# Patient Record
Sex: Female | Born: 1945 | Race: White | Hispanic: No | Smoking: Former smoker
Health system: Southern US, Community
[De-identification: ages and names within clinical notes are randomized; demographics above are authoritative.]

## PROBLEM LIST (undated history)

## (undated) DIAGNOSIS — M2042 Other hammer toe(s) (acquired), left foot: Secondary | ICD-10-CM

## (undated) DIAGNOSIS — K219 Gastro-esophageal reflux disease without esophagitis: Secondary | ICD-10-CM

## (undated) DIAGNOSIS — M2041 Other hammer toe(s) (acquired), right foot: Secondary | ICD-10-CM

## (undated) DIAGNOSIS — R200 Anesthesia of skin: Secondary | ICD-10-CM

## (undated) HISTORY — DX: Gastro-esophageal reflux disease without esophagitis: K21.9

## (undated) HISTORY — DX: Other hammer toe(s) (acquired), right foot: M20.42

## (undated) HISTORY — DX: Other hammer toe(s) (acquired), right foot: M20.41

## (undated) HISTORY — DX: Anesthesia of skin: R20.0

---

## 1964-10-19 HISTORY — PX: NOSE SURGERY: SHX723

## 2017-08-13 ENCOUNTER — Ambulatory Visit (INDEPENDENT_AMBULATORY_CARE_PROVIDER_SITE_OTHER): Payer: Medicare Other

## 2017-08-13 ENCOUNTER — Encounter (INDEPENDENT_AMBULATORY_CARE_PROVIDER_SITE_OTHER): Payer: Self-pay | Admitting: Specialist

## 2017-08-13 ENCOUNTER — Ambulatory Visit (INDEPENDENT_AMBULATORY_CARE_PROVIDER_SITE_OTHER): Payer: Medicare Other | Admitting: Specialist

## 2017-08-13 VITALS — BP 164/85 | HR 64 | Ht 68.0 in | Wt 160.0 lb

## 2017-08-13 DIAGNOSIS — M5441 Lumbago with sciatica, right side: Secondary | ICD-10-CM

## 2017-08-13 DIAGNOSIS — M19041 Primary osteoarthritis, right hand: Secondary | ICD-10-CM | POA: Diagnosis not present

## 2017-08-13 DIAGNOSIS — R200 Anesthesia of skin: Secondary | ICD-10-CM | POA: Diagnosis not present

## 2017-08-13 MED ORDER — MELOXICAM 15 MG PO TABS: 15.0000 mg | ORAL_TABLET | Freq: Every day | ORAL | 3 refills | Status: AC

## 2017-08-13 NOTE — Progress Notes (Signed)
Office Visit Note   Patient: Jill George           Date of Birth: [birth year]           MRN: [id number] Visit Date: 08/13/2017              Requested by: Kathrin Greathouse, NT No address on file PCP: Patient, No Pcp Per   Assessment & Plan: Visit Diagnoses:  1. Right-sided low back pain with right-sided sciatica, unspecified chronicity   2. Primary osteoarthritis, right hand   3. Numbness of legs     Plan:Avoid frequent bending and stooping  No lifting greater than 10 lbs. May use ice or moist heat for pain. Weight loss is of benefit. Handicap license is approved.  Avoid overhead lifting and overhead use of the arms. Do not lift greater than 10 lbs. Tylenol ES one every 6-8 hours for pain and inflamation. Meloxicam 15 mg one time per day.. PT for another 2-3 weeks, then a home exercise program. Referral to neurology for eval of bilateral leg numbness. Referral to PT, please call the therapist you saw for your shoulder and use the presciption provided to obtain therapy.  Follow-Up Instructions: Return in about 5 weeks (around 09/17/2017).   Orders:  Orders Placed This Encounter  Procedures  . XR Lumbar Spine 2-3 Views   No orders of the defined types were placed in this encounter.     Procedures: No procedures performed   Clinical Data: No additional findings.   Subjective: Chief Complaint  Patient presents with  . Lower Back - Pain  . Right Leg - Numbness    71 year old female with history of back pain since 2006. At that time she fell when she caught her leg while in a bathroom falling onto her back betwenn the shoulder blades and the lower back.  She did not see anyone or have xrays. Her husband was ill and was in the hospital for 6-7 months with pancreatitis, he passed away this past 12/01/16. Her joints are wearing out. She has pain in the left shoulder especially with lying on the left. Pain the left shoulder joint, had xray done in [place]. [place], . She  had trouble with a left shoulder being stiff with decreased ROM about 4-5 year ago. Right arm with alittle pain. Right thumb basal joint pain. Back pain is worse with bending stooping, sweeping and mopping. She is using the mower intermittantly.    Review of Systems  Constitutional: Positive for fatigue. Negative for activity change, appetite change and unexpected weight change.  HENT: Positive for congestion, rhinorrhea, sneezing, sore throat, tinnitus and voice change. Negative for trouble swallowing.   Eyes: Positive for itching and visual disturbance. Negative for photophobia, pain, discharge and redness.  Respiratory: Positive for cough, chest tightness and shortness of breath. Negative for wheezing.   Cardiovascular: Negative.  Negative for chest pain, palpitations and leg swelling.  Gastrointestinal: Negative.  Negative for abdominal distention, abdominal pain, anal bleeding, blood in stool, constipation, diarrhea, nausea, rectal pain and vomiting.  Endocrine: Positive for cold intolerance. Negative for heat intolerance.  Genitourinary: Positive for urgency. Negative for difficulty urinating, dyspareunia, dysuria, enuresis, flank pain, frequency and hematuria.  Musculoskeletal: Positive for arthralgias, back pain, joint swelling and neck stiffness.  Skin: Negative.  Negative for color change, pallor, rash and wound.  Allergic/Immunologic: Positive for environmental allergies. Negative for food allergies.  Neurological: Positive for light-headedness, numbness and headaches. Negative for dizziness, tremors, seizures, syncope, facial asymmetry,  speech difficulty and weakness.  Hematological: Negative.  Negative for adenopathy. Does not bruise/bleed easily.  Psychiatric/Behavioral: Negative.  Negative for agitation, behavioral problems, confusion, decreased concentration, dysphoric mood, hallucinations, self-injury, sleep disturbance and suicidal ideas. The patient is not nervous/anxious and is  not hyperactive.      Objective: Vital Signs: BP (!) 164/85 (BP Location: Left Arm, Patient Position: Sitting)   Pulse 64   Ht 5\' 8"  (1.727 m)   Wt 160 lb (72.6 kg)   BMI 24.33 kg/m   Physical Exam  Constitutional: She is oriented to person, place, and time. She appears well-developed and well-nourished. No distress.  HENT:  Head: Normocephalic and atraumatic.  Eyes: Pupils are equal, round, and reactive to light. EOM are normal. Right eye exhibits no discharge. Left eye exhibits no discharge.  Neck: Normal range of motion. Neck supple.  Pulmonary/Chest: Effort normal and breath sounds normal. No respiratory distress. She has no wheezes.  Abdominal: Soft. Bowel sounds are normal. She exhibits no distension and no mass. There is no tenderness. There is no guarding.  Musculoskeletal: She exhibits no edema, tenderness or deformity.  Neurological: She is alert and oriented to person, place, and time.  Skin: Skin is warm and dry. No rash noted. She is not diaphoretic. No erythema. No pallor.  Psychiatric: She has a normal mood and affect. Her behavior is normal. Judgment and thought content normal.    Right Hip Exam  Right hip exam is normal.   Range of Motion  Extension: normal  Flexion: normal  Internal Rotation: normal  External Rotation: normal  Abduction: normal  Adduction: normal   Muscle Strength  Abduction: 5/5  Adduction: 5/5  Flexion: 5/5   Tests  FABER: positive  Other  Erythema: absent Scars: absent Sensation: normal Pulse: present   Left Hip Exam  Left hip exam is normal.  Range of Motion  Extension: normal  Flexion: normal  Internal Rotation: normal  External Rotation: normal  Abduction: normal  Adduction: normal   Muscle Strength  Abduction: 5/5  Adduction: 5/5  Flexion: 5/5   Tests  FABER: negative Ober: negative  Other  Sensation: normal Pulse: present   Back Exam   Tenderness  The patient is experiencing tenderness in the  lumbar.  Range of Motion  Extension: abnormal  Flexion:  80 abnormal  Lateral Bend Right: normal  Lateral Bend Left: normal  Rotation Right: normal  Rotation Left: normal   Muscle Strength  The patient has normal back strength. Right Quadriceps:  5/5  Left Quadriceps:  5/5  Right Hamstrings:  5/5  Left Hamstrings:  5/5   Tests  Straight leg raise right: negative Straight leg raise left: negative  Reflexes  Patellar: normal Achilles: normal Biceps: normal Babinski's sign: normal   Other  Toe Walk: normal Heel Walk: normal Erythema: no back redness Scars: absent   Right Hand Exam   Tenderness  The patient is experiencing tenderness in the snuff box, radial area and dorsal area.  Range of Motion   Wrist  Extension: normal  Flexion: normal  Pronation: normal  Supination: normal   Muscle Strength  Wrist Extension: 5/5  Wrist Flexion: 5/5  Grip: 5/5   Tests  Phalen's Sign: negative Tinel's Sign (Medial Nerve): negative Finkelstein: negative  Other  Erythema: absent Scars: absent Sensation: normal Pulse: present  Comments:  Right thumb MCC and CC joint with mild swelling       Specialty Comments:  No specialty comments available.  Imaging: Xr Lumbar  Spine 2-3 Views  Result Date: 08/13/2017 Left lumbar curve 5 degrees with right laterallisthesis L2-3 and L3-4, lateral radiograph without anterolisthesis there is mild disc narrowing L4-5 and moderate narrowing L5-S1 with anterior spurs L4-5 and L5-S1.    PMFS History: There are no active problems to display for this patient.  No past medical history on file.  No family history on file.  Past Surgical History:  Procedure Laterality Date  . NOSE SURGERY  1966   Social History   Occupational History  . Not on file.   Social History Main Topics  . Smoking status: Former Games developermoker  . Smokeless tobacco: Never Used  . Alcohol use Not on file  . Drug use: Unknown  . Sexual activity: Not on  file

## 2017-08-13 NOTE — Patient Instructions (Addendum)
Avoid frequent bending and stooping  No lifting greater than 10 lbs. May use ice or moist heat for pain. Weight loss is of benefit. Handicap license is approved.  Avoid overhead lifting and overhead use of the arms. Do not lift greater than 10 lbs. Tylenol ES one every 6-8 hours for pain and inflamation. Meloxicam 15 mg one time per day.. PT for another 2-3 weeks, then a home exercise program. Referral to neurology for eval of bilateral leg numbness. Referral to PT, please call the therapist you saw for your shoulder and use the presciption provided to obtain therapy.

## 2017-09-24 ENCOUNTER — Ambulatory Visit (INDEPENDENT_AMBULATORY_CARE_PROVIDER_SITE_OTHER): Payer: Medicare Other | Admitting: Specialist

## 2017-11-08 ENCOUNTER — Ambulatory Visit (INDEPENDENT_AMBULATORY_CARE_PROVIDER_SITE_OTHER): Payer: Medicare Other | Admitting: Neurology

## 2017-11-08 ENCOUNTER — Encounter: Payer: Self-pay | Admitting: Neurology

## 2017-11-08 VITALS — BP 148/69 | HR 63 | Ht 68.0 in | Wt 162.0 lb

## 2017-11-08 DIAGNOSIS — M5416 Radiculopathy, lumbar region: Secondary | ICD-10-CM | POA: Insufficient documentation

## 2017-11-08 DIAGNOSIS — M5136 Other intervertebral disc degeneration, lumbar region: Secondary | ICD-10-CM

## 2017-11-08 MED ORDER — NORTRIPTYLINE HCL 10 MG PO CAPS: 10.0000 mg | ORAL_CAPSULE | Freq: Every day | ORAL | 11 refills | Status: AC

## 2017-11-08 MED ORDER — GABAPENTIN 100 MG PO CAPS: 100.0000 mg | ORAL_CAPSULE | Freq: Three times a day (TID) | ORAL | 11 refills | Status: AC

## 2017-11-08 NOTE — Progress Notes (Signed)
PATIENT: Jill George DOB: 03/16/1946  Chief Complaint  Patient presents with  . Numbness    Reports numbness in right leg into her right foot that has been progressing since 2006.  Recently, she has also started experiencing numbness in her left toes.  . Orthopaedics    Jill ChampagneNitka, James E, MD - referring provider  . PCP    Jill GreathouseWright, Pamela, PA     HISTORICAL  Jill George is a 72 years old female, seen in refer by orthopedic surgeon Dr. Kerrin ChampagneNitka, James George, for evaluation of right leg paresthesia, initial evaluation was on November 08, 2017.  She complains of right-sided radicular pain since 2006, starting from right lower back, radiating to right lateral thigh and, right lateral leg, gradually getting worse, she was treated with chiropractor in 2016 without significant improvement, she now noticed right calf muscle atrophy, mildly limited, dragging her right foot, right side hammertoes, intermittent left leg numbness,  She denies bowel bladder incontinence,  Personally reviewed x-ray of lumbar in October 2018, left lumbar curve 5 degree with right lateral listhesis L2-3, and L3-4, L4-5 moderate narrowing  REVIEW OF SYSTEMS: Full 14 system review of systems performed and notable only for depression, too much sleep, change in appetite, recent falls, numbness, joint pain, runny nose, incontinence, itching, ringing the ears, fatigue  ALLERGIES: No Known Allergies  HOME MEDICATIONS: Current Outpatient Medications  Medication Sig Dispense Refill  . ALPRAZolam (XANAX) 0.25 MG tablet Take 0.25 mg by mouth daily.    . calcium citrate-vitamin D (CITRACAL+D) 315-200 MG-UNIT tablet Take 1 tablet by mouth 2 (two) times daily.    . cholecalciferol (VITAMIN D) 400 units TABS tablet Take 400 Units by mouth.    . doxepin (SINEQUAN) 10 MG capsule Take 0.1 mg by mouth daily.    Marland Kitchen. loratadine (CLARITIN) 10 MG tablet Take 10 mg by mouth daily.    . meloxicam (MOBIC) 15 MG tablet Take 1 tablet (15 mg  total) by mouth daily. 30 tablet 3  . Multiple Vitamins-Minerals (CENTRUM SILVER PO) Take by mouth.    . psyllium (METAMUCIL) 58.6 % packet Take 1 packet by mouth daily.    . ranitidine (ZANTAC) 150 MG tablet Take 150 mg by mouth daily.    . vitamin B-12 (CYANOCOBALAMIN) 1000 MCG tablet Take 1,000 mcg by mouth daily.     No current facility-administered medications for this visit.     PAST MEDICAL HISTORY: Past Medical History:  Diagnosis Date  . Acid reflux   . Hammer toes, bilateral   . Numbness     PAST SURGICAL HISTORY: Past Surgical History:  Procedure Laterality Date  . NOSE SURGERY  1966    FAMILY HISTORY: Family History  Problem Relation Age of Onset  . Heart disease Mother   . Other Father        tractor accident    SOCIAL HISTORY:  Social History   Socioeconomic History  . Marital status: Widowed    Spouse name: Not on file  . Number of children: 1  . Years of education: 6212  . Highest education level: High school graduate  Social Needs  . Financial resource strain: Not on file  . Food insecurity - worry: Not on file  . Food insecurity - inability: Not on file  . Transportation needs - medical: Not on file  . Transportation needs - non-medical: Not on file  Occupational History  . Occupation: Retired  Tobacco Use  . Smoking status: Former Games developermoker  .  Smokeless tobacco: Never Used  Substance and Sexual Activity  . Alcohol use: No    Frequency: Never  . Drug use: No  . Sexual activity: Not on file  Other Topics Concern  . Not on file  Social History Narrative   Lives at home alone.   Right-handed.   No caffeine use.     PHYSICAL EXAM   Vitals:   11/08/17 1258  BP: (!) 148/69  Pulse: 63  Weight: 162 lb (73.5 kg)  Height: 5\' 8"  (1.727 m)    Not recorded      Body mass index is 24.63 kg/m.  PHYSICAL EXAMNIATION:  Gen: NAD, conversant, well nourised, obese, well groomed                     Cardiovascular: Regular rate rhythm, no  peripheral edema, warm, nontender. Eyes: Conjunctivae clear without exudates or hemorrhage Neck: Supple, no carotid bruits. Pulmonary: Clear to auscultation bilaterally   NEUROLOGICAL EXAM:  MENTAL STATUS: Speech:    Speech is normal; fluent and spontaneous with normal comprehension.  Cognition:     Orientation to time, place and person     Normal recent and remote memory     Normal Attention span and concentration     Normal Language, naming, repeating,spontaneous speech     Fund of knowledge   CRANIAL NERVES: CN II: Visual fields are full to confrontation. Fundoscopic exam is normal with sharp discs and no vascular changes. Pupils are round equal and briskly reactive to light. CN III, IV, VI: extraocular movement are normal. No ptosis. CN V: Facial sensation is intact to pinprick in all 3 divisions bilaterally. Corneal responses are intact.  CN VII: Face is symmetric with normal eye closure and smile. CN VIII: Hearing is normal to rubbing fingers CN IX, X: Palate elevates symmetrically. Phonation is normal. CN XI: Head turning and shoulder shrug are intact CN XII: Tongue is midline with normal movements and no atrophy.  MOTOR: Right calf muscle atrophy, right hammertoes, mild weakness in right total extension and flexion  REFLEXES: Reflexes are 2+ and symmetric at the biceps, triceps, knees, and ankles. Plantar responses are flexor.  SENSORY: Intact to light touch, pinprick, positional sensation and vibratory sensation are intact in fingers and toes.  COORDINATION: Rapid alternating movements and fine finger movements are intact. There is no dysmetria on finger-to-nose and heel-knee-shin.    GAIT/STANCE: Posture is normal. Gait is steady with normal steps, base, arm swing, and turning. Heel and toe walking are normal. Tandem gait is normal.  Romberg is absent.   DIAGNOSTIC DATA (LABS, IMAGING, TESTING) - I reviewed patient records, labs, notes, testing and imaging  myself where available.   ASSESSMENT AND PLAN  Jill George is a 72 y.o. female   Right lumbosacral radiculopathy  Proceed with MRI of lumbar  EMG nerve conduction study  She has tried gabapentin in the past, could not tolerated, will try low-dose nortriptyline 10 mg every day   Levert Feinstein, M.D. Ph.D.  [facility] [address] [place], [place] [address] Ph: [phone] Fax: [phone]  [id number], Guy Sandifer, MD

## 2017-11-15 ENCOUNTER — Ambulatory Visit (INDEPENDENT_AMBULATORY_CARE_PROVIDER_SITE_OTHER): Payer: Medicare Other | Admitting: Specialist

## 2017-11-22 ENCOUNTER — Ambulatory Visit
Admission: RE | Admit: 2017-11-22 | Discharge: 2017-11-22 | Disposition: A | Discharge status: Home / Self Care | Payer: Medicare Other | Source: Ambulatory Visit | Attending: Neurology | Admitting: Neurology

## 2017-11-22 DIAGNOSIS — M5136 Other intervertebral disc degeneration, lumbar region: Secondary | ICD-10-CM

## 2017-11-24 ENCOUNTER — Telehealth: Payer: Self-pay | Admitting: Neurology

## 2017-11-24 NOTE — Telephone Encounter (Signed)
MRI of lumbar spine showed multiple level degenerative changes, most severe at L5-S1, potential S1 nerve root compression, we will review films at her EMG nerve conduction appointment on November 26, 2016,  IMPRESSION: Small central disc protrusion L2-3 without stenosis  Mild subarticular stenosis bilaterally L4-5  Right-sided disc protrusion L5-S1 with right S1 nerve root impingement.

## 2017-11-26 ENCOUNTER — Encounter (INDEPENDENT_AMBULATORY_CARE_PROVIDER_SITE_OTHER): Payer: Medicare Other

## 2017-11-26 ENCOUNTER — Ambulatory Visit (INDEPENDENT_AMBULATORY_CARE_PROVIDER_SITE_OTHER): Payer: Medicare Other | Admitting: Neurology

## 2017-11-26 DIAGNOSIS — Z0289 Encounter for other administrative examinations: Secondary | ICD-10-CM

## 2017-11-26 DIAGNOSIS — M5136 Other intervertebral disc degeneration, lumbar region: Secondary | ICD-10-CM

## 2017-11-26 DIAGNOSIS — M5416 Radiculopathy, lumbar region: Secondary | ICD-10-CM

## 2017-11-26 NOTE — Progress Notes (Signed)
PATIENT: Jill George DOB: 1945-11-06  No chief complaint on file.    HISTORICAL  Jill George is a 72 years old female, seen in refer by orthopedic surgeon Dr. Kerrin ChampagneNitka, James E, for evaluation of right leg paresthesia, initial evaluation was on November 08, 2017.  She complains of right-sided radicular pain since 2006, starting from right lower back, radiating to right lateral thigh and, right lateral leg, gradually getting worse, she was treated with chiropractor in 2016 without significant improvement, she now noticed right calf muscle atrophy, mildly limited, dragging her right foot, right side hammertoes, intermittent left leg numbness,  She denies bowel bladder incontinence,  Personally reviewed x-ray of lumbar in October 2018, left lumbar curve 5 degree with right lateral listhesis L2-3, and L3-4, L4-5 moderate narrowing  UPDATE Nov 26 2017: She complains of right lateral leg numbness, radiating paresthesia to the bottom of her right foot, mild weakness of right leg, atrophy of right calf muscles,  EMG nerve conduction study today showed mild chronic right S1 radiculopathy,  We have personally reviewed MRI of lumbar spine in February 2019, right side disc protrusion at L5-S1 with right S1 nerve root impingement   She reported mild improvement with nortriptyline 10 mg every night   REVIEW OF SYSTEMS: Full 14 system review of systems performed and notable only for depression, too much sleep, change in appetite, recent falls, numbness, joint pain, runny nose, incontinence, itching, ringing the ears, fatigue  ALLERGIES: No Known Allergies  HOME MEDICATIONS: Current Outpatient Medications  Medication Sig Dispense Refill  . ALPRAZolam (XANAX) 0.25 MG tablet Take 0.25 mg by mouth daily.    . calcium citrate-vitamin D (CITRACAL+D) 315-200 MG-UNIT tablet Take 1 tablet by mouth 2 (two) times daily.    . cholecalciferol (VITAMIN D) 400 units TABS tablet Take 400 Units by mouth.     . doxepin (SINEQUAN) 10 MG capsule Take 0.1 mg by mouth daily.    Marland Kitchen. gabapentin (NEURONTIN) 100 MG capsule Take 1 capsule (100 mg total) by mouth 3 (three) times daily. 90 capsule 11  . loratadine (CLARITIN) 10 MG tablet Take 10 mg by mouth daily.    . meloxicam (MOBIC) 15 MG tablet Take 1 tablet (15 mg total) by mouth daily. 30 tablet 3  . Multiple Vitamins-Minerals (CENTRUM SILVER PO) Take by mouth.    . nortriptyline (PAMELOR) 10 MG capsule Take 1 capsule (10 mg total) by mouth at bedtime. Please discard previous gabapentin order 30 capsule 11  . psyllium (METAMUCIL) 58.6 % packet Take 1 packet by mouth daily.    . ranitidine (ZANTAC) 150 MG tablet Take 150 mg by mouth daily.    . vitamin B-12 (CYANOCOBALAMIN) 1000 MCG tablet Take 1,000 mcg by mouth daily.     No current facility-administered medications for this visit.     PAST MEDICAL HISTORY: Past Medical History:  Diagnosis Date  . Acid reflux   . Hammer toes, bilateral   . Numbness     PAST SURGICAL HISTORY: Past Surgical History:  Procedure Laterality Date  . NOSE SURGERY  1966    FAMILY HISTORY: Family History  Problem Relation Age of Onset  . Heart disease Mother   . Other Father        tractor accident    SOCIAL HISTORY:  Social History   Socioeconomic History  . Marital status: Widowed    Spouse name: Not on file  . Number of children: 1  . Years of education: 3012  . Highest  education level: High school graduate  Social Needs  . Financial resource strain: Not on file  . Food insecurity - worry: Not on file  . Food insecurity - inability: Not on file  . Transportation needs - medical: Not on file  . Transportation needs - non-medical: Not on file  Occupational History  . Occupation: Retired  Tobacco Use  . Smoking status: Former Games developer  . Smokeless tobacco: Never Used  Substance and Sexual Activity  . Alcohol use: No    Frequency: Never  . Drug use: No  . Sexual activity: Not on file  Other  Topics Concern  . Not on file  Social History Narrative   Lives at home alone.   Right-handed.   No caffeine use.     PHYSICAL EXAM   There were no vitals filed for this visit.  Not recorded      There is no height or weight on file to calculate BMI.  PHYSICAL EXAMNIATION:  Gen: NAD, conversant, well nourised, obese, well groomed                     Cardiovascular: Regular rate rhythm, no peripheral edema, warm, nontender. Eyes: Conjunctivae clear without exudates or hemorrhage Neck: Supple, no carotid bruits. Pulmonary: Clear to auscultation bilaterally   NEUROLOGICAL EXAM:  MENTAL STATUS: Speech:    Speech is normal; fluent and spontaneous with normal comprehension.  Cognition:     Orientation to time, place and person     Normal recent and remote memory     Normal Attention span and concentration     Normal Language, naming, repeating,spontaneous speech     Fund of knowledge   CRANIAL NERVES: CN II: Visual fields are full to confrontation. Fundoscopic exam is normal with sharp discs and no vascular changes. Pupils are round equal and briskly reactive to light. CN III, IV, VI: extraocular movement are normal. No ptosis. CN V: Facial sensation is intact to pinprick in all 3 divisions bilaterally. Corneal responses are intact.  CN VII: Face is symmetric with normal eye closure and smile. CN VIII: Hearing is normal to rubbing fingers CN IX, X: Palate elevates symmetrically. Phonation is normal. CN XI: Head turning and shoulder shrug are intact CN XII: Tongue is midline with normal movements and no atrophy.  MOTOR: Right calf muscle atrophy, right hammertoes, mild weakness in right total extension and flexion  REFLEXES: Reflexes are 2+ and symmetric at the biceps, triceps, knees, and trace at right ankle. Plantar responses are flexor.  SENSORY: Intact to light touch, pinprick, positional sensation and vibratory sensation are intact in fingers and  toes.  COORDINATION: Rapid alternating movements and fine finger movements are intact. There is no dysmetria on finger-to-nose and heel-knee-shin.    GAIT/STANCE: Posture is normal. Gait is steady with normal steps, base, arm swing, and turning.  She has difficulty with right tiptoe walking, mild difficulty with right heel walking. Romberg is absent.   DIAGNOSTIC DATA (LABS, IMAGING, TESTING) - I reviewed patient records, labs, notes, testing and imaging myself where available.   ASSESSMENT AND PLAN  Jill George is a 72 y.o. female   Right lumbosacral radiculopathy  MRI of lumbar in February 2019 showed right side disc protrusion at L5-S1 with right S1 nerve root impingement.  EMG nerve conduction study confirmed chronic right S1 radiculopathy  Continue low-dose nortriptyline 10 mg every day   Levert Feinstein, M.D. Ph.D.  [facility] [address] [place], [place] [address]  Ph: [phone] Fax: [phone]  [id number]:HQION, Guy Sandifer, MD

## 2017-11-26 NOTE — Procedures (Signed)
Full Name: Salem CasterGipsey Vandunk Gender: Female MRN #: [id number] Date of Birth: [birth year]    Visit Date: 11/26/2017 09:12 Age: 89 Years 6 Months Old Examining Physician: Levert FeinsteinYijun Patrich Heinze, MD  Referring Physician: Terrace ArabiaYan, MD History: 72 year old female, complains of right-sided low back pain, radiating pain into the right lateral leg, bottom of right foot.  Summary of the tests: Nerve conduction study: Bilateral sural, superfacial peroneal sensory responses were normal.  Bilateral tibial, peroneal to EDB motor responses were normal.  Bilateral tibial H reflexes were present, right side was 1.6 ms prolonged in comparison to the left side.  Electromyography: Selective needle examinations of right lower extremity muscles showed chronic neuropathic changes involving right medial gastrocnemius, short head of biceps femoris, slight involvement of right gluteus medius.  There was no active denervation and right lumbosacral paraspinal muscles.    Conclusion:  This is an abnormal study.  There is electrodiagnostic evidence of chronic mild right S1 radiculopathy.   ------------------------------- Levert FeinsteinYijun Dorean Hiebert, M.D.  [facility] [address] [place], [place] [address] Tel: [phone] Fax: [phone]        [facility]    Nerve / Sites Muscle Latency Ref. Amplitude Ref. Rel Amp Segments Distance Velocity Ref. Area    ms ms mV mV %  cm m/s m/s mVms  R Peroneal - EDB     Ankle EDB 5.9 ?6.5 2.8 ?2.0 100 Ankle - EDB 9   12.6     Fib head EDB 13.3  2.5  89.9 Fib head - Ankle 33 45 ?44 10.9     Pop fossa EDB 15.6  2.5  102 Pop fossa - Fib head 10 44 ?44 12.6         Pop fossa - Ankle      L Peroneal - EDB     Ankle EDB 4.7 ?6.5 4.0 ?2.0 100 Ankle - EDB 9   11.7     Fib head EDB 11.9  3.5  88.7 Fib head - Ankle 33 46 ?44 11.4     Pop fossa EDB 14.2  3.9  111 Pop fossa - Fib head 10 45 ?44 14.1         Pop fossa - Ankle      R Tibial - AH     Ankle AH 4.8 ?5.8 6.3 ?4.0 100 Ankle - AH 9    21.0     Pop fossa AH 15.0  5.0  78.7 Pop fossa - Ankle 37 36 ?41 16.7  L Tibial - AH     Ankle AH 4.6 ?5.8 5.4 ?4.0 100 Ankle - AH 9   13.3     Pop fossa AH 13.7  3.5  63.8 Pop fossa - Ankle 37 41 ?41 11.2             SNC    Nerve / Sites Rec. Site Peak Lat Ref.  Amp Ref. Segments Distance    ms ms V V  cm  R Sural - Ankle (Calf)     Calf Ankle 3.4 ?4.4 9 ?6 Calf - Ankle 14  L Sural - Ankle (Calf)     Calf Ankle 3.5 ?4.4 6 ?6 Calf - Ankle 14  R Superficial peroneal - Ankle     Lat leg Ankle 4.0 ?4.4 2 ?6 Lat leg - Ankle 14  L Superficial peroneal - Ankle     Lat leg Ankle 4.1 ?4.4 3 ?6 Lat leg - Ankle 14  F  Wave    Nerve F Lat Ref.   ms ms  R Tibial - AH 58.5 ?56.0  L Tibial - AH 57.3 ?56.0         H Reflex    Nerve H Lat Lat Hmax   ms ms   Left Right Ref. Left Right Ref.  Tibial - Soleus 40.7 41.3 ?35.0 45.9 20.4 ?35.0         EMG full       EMG Summary Table    Spontaneous MUAP Recruitment  Muscle IA Fib PSW Fasc Other Amp Dur. Poly Pattern  R. Tibialis anterior Normal None None None _______ Normal Normal Normal Normal  R. Tibialis posterior Normal None None None _______ Normal Normal Normal Normal  R. Peroneus longus Normal None None None _______ Normal Normal Normal Normal  R. Gastrocnemius (Medial head) Increased None None None _______ Increased Increased Normal Reduced  R. Vastus lateralis Normal None None None _______ Normal Normal Normal Normal  R. Biceps femoris (short head) Normal None None None _______ Increased Normal Normal Reduced  R. Gluteus medius Normal None None None _______ Increased Increased Normal Reduced  R. Lumbar paraspinals (mid) Normal None None None _______ Normal Normal Normal Normal  R. Lumbar paraspinals (low) Normal None None None _______ Normal Normal Normal Normal

## 2017-12-13 ENCOUNTER — Ambulatory Visit (INDEPENDENT_AMBULATORY_CARE_PROVIDER_SITE_OTHER): Payer: Medicare Other | Admitting: Specialist

## 2017-12-13 ENCOUNTER — Encounter (INDEPENDENT_AMBULATORY_CARE_PROVIDER_SITE_OTHER): Payer: Self-pay | Admitting: Specialist

## 2017-12-13 DIAGNOSIS — M5116 Intervertebral disc disorders with radiculopathy, lumbar region: Secondary | ICD-10-CM | POA: Diagnosis not present

## 2017-12-13 NOTE — Patient Instructions (Signed)
Avoid frequent bending and stooping  No lifting greater than 10 lbs. May use ice or moist heat for pain. Weight loss is of benefit. Handicap license is approved.   

## 2017-12-13 NOTE — Progress Notes (Addendum)
Office Visit Note   Patient: Jill George           Date of Birth: 11-09-1945           MRN: [id number] Visit Date: 12/13/2017              Requested by: No referring provider defined for this encounter. PCP: System, Pcp Not In   Assessment & Plan: Visit Diagnoses:  1. Lumbar disc herniation with radiculopathy     Plan: Avoid frequent bending and stooping  No lifting greater than 10 lbs. May use ice or moist heat for pain. Weight loss is of benefit. Handicap license is approved. Follow-Up Instructions: Return in about 4 weeks (around 01/10/2018).   Orders:  No orders of the defined types were placed in this encounter.  No orders of the defined types were placed in this encounter.     Procedures: No procedures performed   Clinical Data: No additional findings.   Subjective: No chief complaint on file.   72 year female with persistent numbness in the right leg the pain is less but the numbness persists. No bowel difficulty. Does have bladder difficutly with use of a pad intermittantly. She has been to PT and notices with prolong walking the right leg does get weak. She had excruiating had, saw a D.C. Then had acupuncture with some improvement, the leg then The right leg became numb and the pain improves substantially    Review of Systems  Constitutional: Negative.   HENT: Negative.   Eyes: Negative.   Respiratory: Negative.   Cardiovascular: Negative.   Gastrointestinal: Negative.   Endocrine: Negative.   Genitourinary: Negative.   Musculoskeletal: Negative.   Skin: Negative.   Allergic/Immunologic: Negative.   Neurological: Negative.   Hematological: Negative.   Psychiatric/Behavioral: Negative.      Objective: Vital Signs: There were no vitals taken for this visit.  Physical Exam  Constitutional: She is oriented to person, place, and time. She appears well-developed and well-nourished.  HENT:  Head: Normocephalic and atraumatic.  Eyes: EOM  are normal. Pupils are equal, round, and reactive to light.  Neck: Normal range of motion. Neck supple.  Pulmonary/Chest: Effort normal and breath sounds normal.  Abdominal: Soft. Bowel sounds are normal.  Neurological: She is alert and oriented to person, place, and time.  Skin: Skin is warm and dry.  Psychiatric: She has a normal mood and affect. Her behavior is normal. Judgment and thought content normal.    Back Exam   Range of Motion  Extension: abnormal  Flexion: abnormal  Lateral bend right: normal  Lateral bend left: normal  Rotation right: normal  Rotation left: normal   Muscle Strength  The patient has normal back strength. Right Quadriceps:  5/5  Left Quadriceps:  5/5  Right Hamstrings:  5/5  Left Hamstrings:  5/5   Tests  Straight leg raise right: negative Straight leg raise left: negative  Reflexes  Patellar: abnormal Achilles: abnormal Babinski's sign: normal   Other  Toe walk: normal Heel walk: normal Sensation: normal Erythema: no back redness Scars: absent      Specialty Comments:  No specialty comments available.  Imaging: No results found.   PMFS History: Patient Active Problem List   Diagnosis Date Noted  . Other intervertebral disc degeneration, lumbar region 11/26/2017  . Right lumbar radiculopathy 11/08/2017   Past Medical History:  Diagnosis Date  . Acid reflux   . Hammer toes, bilateral   . Numbness  Family History  Problem Relation Age of Onset  . Heart disease Mother   . Other Father        tractor accident    Past Surgical History:  Procedure Laterality Date  . NOSE SURGERY  1966   Social History   Occupational History  . Occupation: Retired  Tobacco Use  . Smoking status: Former Games developermoker  . Smokeless tobacco: Never Used  Substance and Sexual Activity  . Alcohol use: No    Frequency: Never  . Drug use: No  . Sexual activity: Not on file

## 2018-01-17 ENCOUNTER — Ambulatory Visit (INDEPENDENT_AMBULATORY_CARE_PROVIDER_SITE_OTHER): Payer: Medicare Other | Admitting: Specialist

## 2018-01-17 ENCOUNTER — Ambulatory Visit (INDEPENDENT_AMBULATORY_CARE_PROVIDER_SITE_OTHER): Payer: Self-pay

## 2018-01-17 ENCOUNTER — Encounter (INDEPENDENT_AMBULATORY_CARE_PROVIDER_SITE_OTHER): Payer: Self-pay | Admitting: Specialist

## 2018-01-17 VITALS — BP 135/64 | HR 56 | Ht 68.0 in | Wt 160.0 lb

## 2018-01-17 DIAGNOSIS — M5416 Radiculopathy, lumbar region: Secondary | ICD-10-CM | POA: Diagnosis not present

## 2018-01-17 DIAGNOSIS — M5136 Other intervertebral disc degeneration, lumbar region: Secondary | ICD-10-CM

## 2018-01-17 DIAGNOSIS — M205X1 Other deformities of toe(s) (acquired), right foot: Secondary | ICD-10-CM | POA: Diagnosis not present

## 2018-01-17 NOTE — Progress Notes (Addendum)
Office Visit Note   Patient: Jill George           Date of Birth: 1946-08-07           MRN: [id number] Visit Date: 01/17/2018              Requested by: No referring provider defined for this encounter. PCP: System, Pcp Not In   Assessment & Plan: Visit Diagnoses:  1. Degenerative disc disease, lumbar   2. Lumbar radiculopathy, chronic   3. Acquired claw toe, right     Plan: Avoid frequent bending and stooping  No lifting greater than 10 lbs. May use ice or moist heat for pain. Weight loss is of benefit. Handicap license is approved.  Follow-Up Instructions: Return in about 3 weeks (around 02/07/2018) for return with Dr. Lajoyce Corners for claw toes right foot,.   Orders:  Orders Placed This Encounter  Procedures  . XR Foot 2 Views Right   No orders of the defined types were placed in this encounter.     Procedures: No procedures performed   Clinical Data: No additional findings.   Subjective: Chief Complaint  Patient presents with  . Lower Back - Follow-up    72 year old female with history of low back and right leg pain pain worse with bending and stooping. History of back injury with sciatica in 2006, New years day. She has delt with numbness for years and has had both MRI and EMG/NCV.   Review of Systems  Constitutional: Negative.   HENT: Negative.   Eyes: Negative.   Respiratory: Negative.   Cardiovascular: Negative.   Gastrointestinal: Negative.   Endocrine: Negative.   Genitourinary: Negative.   Musculoskeletal: Negative.   Skin: Negative.   Allergic/Immunologic: Negative.   Neurological: Negative.   Hematological: Negative.   Psychiatric/Behavioral: Negative.      Objective: Vital Signs: BP 135/64 (BP Location: Left Arm, Patient Position: Sitting)   Pulse (!) 56   Ht 5\' 8"  (1.727 m)   Wt 160 lb (72.6 kg)   BMI 24.33 kg/m   Physical Exam  Constitutional: She is oriented to person, place, and time. She appears well-developed and  well-nourished.  HENT:  Head: Normocephalic and atraumatic.  Eyes: Pupils are equal, round, and reactive to light. EOM are normal.  Neck: Normal range of motion. Neck supple.  Pulmonary/Chest: Effort normal and breath sounds normal.  Abdominal: Soft. Bowel sounds are normal.  Musculoskeletal: Normal range of motion.  Neurological: She is alert and oriented to person, place, and time.  Skin: Skin is warm and dry.  Psychiatric: She has a normal mood and affect. Her behavior is normal. Judgment and thought content normal.    Right Ankle Exam  Right ankle exam is normal.  Tenderness  The patient is experiencing no tenderness.  Comments:  Right foot 3rd through 5th toe with clawing of the MPIP and DIP joints, extension at the MTP joints consistent with claw toe deformity.   Back Exam   Tenderness  The patient is experiencing tenderness in the lumbar.  Range of Motion  Extension: normal  Flexion: normal  Lateral bend right: normal  Lateral bend left: normal  Rotation right: normal  Rotation left: normal   Muscle Strength  Right Quadriceps:  5/5  Left Quadriceps:  5/5  Right Hamstrings:  5/5  Left Hamstrings:  5/5   Tests  Straight leg raise right: negative Straight leg raise left: negative  Reflexes  Babinski's sign: normal   Other  Toe  walk: normal Heel walk: normal Sensation: normal Gait: normal  Erythema: no back redness Scars: absent      Specialty Comments:  No specialty comments available.  Imaging: No results found.   PMFS History: Patient Active Problem List   Diagnosis Date Noted  . Other intervertebral disc degeneration, lumbar region 11/26/2017  . Right lumbar radiculopathy 11/08/2017   Past Medical History:  Diagnosis Date  . Acid reflux   . Hammer toes, bilateral   . Numbness     Family History  Problem Relation Age of Onset  . Heart disease Mother   . Other Father        tractor accident    Past Surgical History:  Procedure  Laterality Date  . NOSE SURGERY  1966   Social History   Occupational History  . Occupation: Retired  Tobacco Use  . Smoking status: Former Games developermoker  . Smokeless tobacco: Never Used  Substance and Sexual Activity  . Alcohol use: No    Frequency: Never  . Drug use: No  . Sexual activity: Not on file

## 2018-01-17 NOTE — Patient Instructions (Addendum)
Avoid frequent bending and stooping  No lifting greater than 10 lbs. May use ice or moist heat for pain. Weight loss is of benefit.   

## 2018-01-17 NOTE — Addendum Note (Signed)
Addended by: Vira BrownsNITKA, JAMES on: 01/17/2018 04:10 PM   Modules accepted: Orders

## 2018-01-25 ENCOUNTER — Ambulatory Visit (INDEPENDENT_AMBULATORY_CARE_PROVIDER_SITE_OTHER): Payer: Medicare Other | Admitting: Orthopedic Surgery

## 2018-01-25 ENCOUNTER — Encounter (INDEPENDENT_AMBULATORY_CARE_PROVIDER_SITE_OTHER): Payer: Self-pay | Admitting: Orthopedic Surgery

## 2018-01-25 VITALS — Ht 68.0 in | Wt 160.0 lb

## 2018-01-25 DIAGNOSIS — M205X1 Other deformities of toe(s) (acquired), right foot: Secondary | ICD-10-CM | POA: Diagnosis not present

## 2018-01-25 DIAGNOSIS — M6701 Short Achilles tendon (acquired), right ankle: Secondary | ICD-10-CM | POA: Diagnosis not present

## 2018-01-25 NOTE — Progress Notes (Signed)
   Office Visit Note   Patient: Jill George           Date of Birth: Apr 06, 1946           MRN: [id number] Visit Date: 01/25/2018              Requested by: No referring provider defined for this encounter. PCP: System, Pcp Not In  Chief Complaint  Patient presents with  . Right Foot - Pain    Discuss possible surgery      HPI: Patient is a 72 year old woman who complains of burning pain across the ball of her right foot as well as painful clawing of her toes.  Assessment & Plan: Visit Diagnoses:  1. Achilles tendon contracture, right   2. Acquired claw toe, right     Plan: Patient was given instruction and demonstrated heel cord stretching to do 5 times a day a minute at a time.  Recommended stiff soled Trail running sneaker or walking sneaker with arch support orthotics to further unload the metatarsal heads.  Follow-up in 2 months will evaluate for resolution of the burning symptoms.  Discussed that we could proceed with claw toe surgery but may need to consider a gastrocnemius recession.  Follow-Up Instructions: Return in about 2 months (around 03/27/2018).   Ortho Exam  Patient is alert, oriented, no adenopathy, well-dressed, normal affect, normal respiratory effort. Examination patient has a good dorsalis pedis pulse.  She has dorsiflexion 10 degrees short of neutral with her knee extended.  She has tenderness to palpation beneath the metatarsal head she has flexible clawing of the toes.  Imaging: No results found. No images are attached to the encounter.  Labs: No results found for: HGBA1C, ESRSEDRATE, CRP, LABURIC, REPTSTATUS, GRAMSTAIN, CULT, LABORGA  @LABSALLVALUES (HGBA1)@  Body mass index is 24.33 kg/m.  Orders:  No orders of the defined types were placed in this encounter.  No orders of the defined types were placed in this encounter.    Procedures: No procedures performed  Clinical Data: No additional findings.  ROS:  All other systems  negative, except as noted in the HPI. Review of Systems  Objective: Vital Signs: Ht 5\' 8"  (1.727 m)   Wt 160 lb (72.6 kg)   BMI 24.33 kg/m   Specialty Comments:  No specialty comments available.  PMFS History: Patient Active Problem List   Diagnosis Date Noted  . Other intervertebral disc degeneration, lumbar region 11/26/2017  . Right lumbar radiculopathy 11/08/2017   Past Medical History:  Diagnosis Date  . Acid reflux   . Hammer toes, bilateral   . Numbness     Family History  Problem Relation Age of Onset  . Heart disease Mother   . Other Father        tractor accident    Past Surgical History:  Procedure Laterality Date  . NOSE SURGERY  1966   Social History   Occupational History  . Occupation: Retired  Tobacco Use  . Smoking status: Former Games developermoker  . Smokeless tobacco: Never Used  Substance and Sexual Activity  . Alcohol use: No    Frequency: Never  . Drug use: No  . Sexual activity: Not on file

## 2018-03-28 ENCOUNTER — Encounter (INDEPENDENT_AMBULATORY_CARE_PROVIDER_SITE_OTHER): Payer: Self-pay | Admitting: Orthopedic Surgery

## 2018-03-28 ENCOUNTER — Ambulatory Visit (INDEPENDENT_AMBULATORY_CARE_PROVIDER_SITE_OTHER): Payer: Medicare Other | Admitting: Orthopedic Surgery

## 2018-03-28 VITALS — Ht 68.0 in | Wt 160.0 lb

## 2018-03-28 DIAGNOSIS — M5136 Other intervertebral disc degeneration, lumbar region: Secondary | ICD-10-CM | POA: Diagnosis not present

## 2018-03-28 DIAGNOSIS — M6701 Short Achilles tendon (acquired), right ankle: Secondary | ICD-10-CM | POA: Diagnosis not present

## 2018-03-28 DIAGNOSIS — M205X1 Other deformities of toe(s) (acquired), right foot: Secondary | ICD-10-CM

## 2018-03-28 NOTE — Progress Notes (Signed)
   Office Visit Note   Patient: Jill George           Date of Birth: 04-26-1946           MRN: [id number] Visit Date: 03/28/2018              Requested by: No referring provider defined for this encounter. PCP: System, Pcp Not In  Chief Complaint  Patient presents with  . Right Foot - Follow-up      HPI: Patient is a 72 year old woman who presents in follow-up for right Achilles contracture as well as numbness over the forefoot.  Patient has obtained a pair of [place] sneakers has been doing heel stretching she states she is been doing better but still has radicular pain from the buttocks down to the right foot.  Patient states she has had radicular symptoms since 2006.  She consulted Dr. Otelia SergeantNitka but there is a chance that her numbness would not resolve with surgery.  Assessment & Plan: Visit Diagnoses:  1. Achilles tendon contracture, right   2. Acquired claw toe, right   3. Degenerative disc disease, lumbar     Plan: Continue with the Achilles stretching recommended continue with the [place]oka sneakers.  Discussed that she could try a multiple B vitamin to help with her nerve symptoms.  Follow-Up Instructions: Return if symptoms worsen or fail to improve.   Ortho Exam  Patient is alert, oriented, no adenopathy, well-dressed, normal affect, normal respiratory effort. Examination patient has a negative straight leg raise bilaterally she has good pulses in both feet she has dorsiflexion about 10 degrees past neutral she does have clawing of the toes.  There is no signs of infection.  Imaging: No results found. No images are attached to the encounter.  Labs: No results found for: HGBA1C, ESRSEDRATE, CRP, LABURIC, REPTSTATUS, GRAMSTAIN, CULT, LABORGA   No results found for: ALBUMIN, PREALBUMIN, LABURIC  Body mass index is 24.33 kg/m.  Orders:  No orders of the defined types were placed in this encounter.  No orders of the defined types were placed in this encounter.    Procedures: No procedures performed  Clinical Data: No additional findings.  ROS:  All other systems negative, except as noted in the HPI. Review of Systems  Objective: Vital Signs: Ht 5\' 8"  (1.727 m)   Wt 160 lb (72.6 kg)   BMI 24.33 kg/m   Specialty Comments:  No specialty comments available.  PMFS History: Patient Active Problem List   Diagnosis Date Noted  . Other intervertebral disc degeneration, lumbar region 11/26/2017  . Right lumbar radiculopathy 11/08/2017   Past Medical History:  Diagnosis Date  . Acid reflux   . Hammer toes, bilateral   . Numbness     Family History  Problem Relation Age of Onset  . Heart disease Mother   . Other Father        tractor accident    Past Surgical History:  Procedure Laterality Date  . NOSE SURGERY  1966   Social History   Occupational History  . Occupation: Retired  Tobacco Use  . Smoking status: Former Games developermoker  . Smokeless tobacco: Never Used  Substance and Sexual Activity  . Alcohol use: No    Frequency: Never  . Drug use: No  . Sexual activity: Not on file

## 2018-10-13 IMAGING — MR MR LUMBAR SPINE W/O CM
4 of 5 series · 18 of 48 positions shown · non-contrast
Comparison: Lumbar spine radiographs 08/13/2017

CLINICAL DATA: Lumbar disc degeneration.  Low back pain

EXAM:
MRI LUMBAR SPINE WITHOUT CONTRAST
TECHNIQUE: Multiplanar, multisequence MR imaging of the lumbar spine was
performed. No intravenous contrast was administered.

[Series 6: T2 · sagittal · 4.0mm · 0.73mm/px · 6 of 15 slices shown (1 of 2)]
[im 1/15]
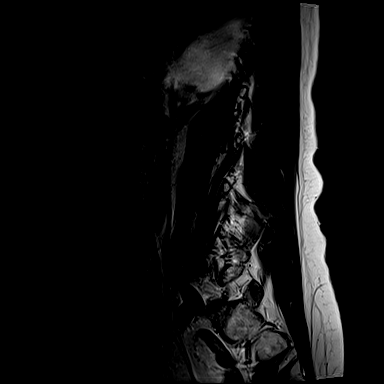
[im 3/15]
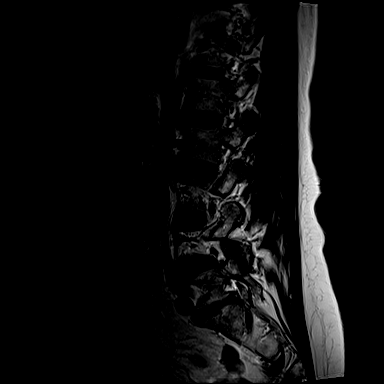
[im 6/15]
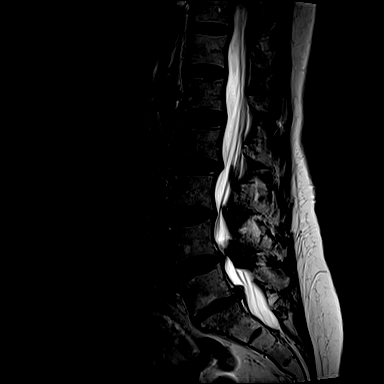
[im 9/15]
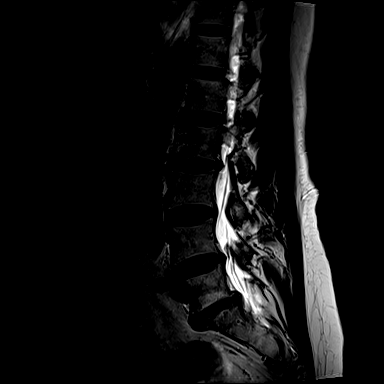
[im 12/15]
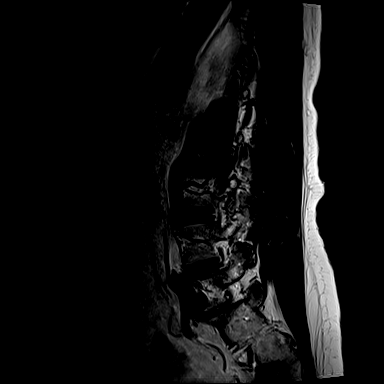
[im 15/15]
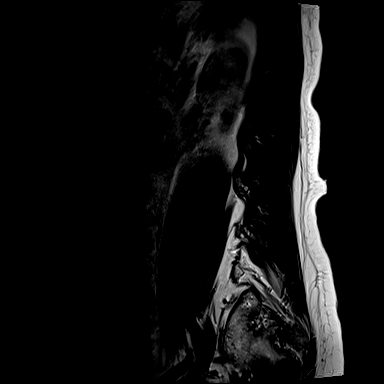

[Series 7: T1 · sagittal · 4.0mm · 0.73mm/px · 3 of 15 slices shown (1 of 2)]
[im 3/15]
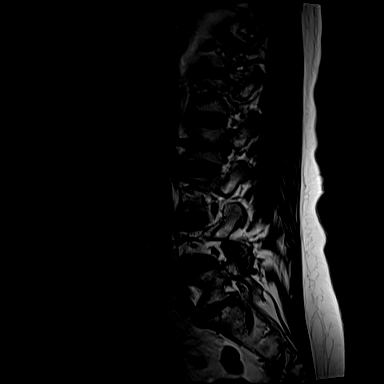
[im 9/15]
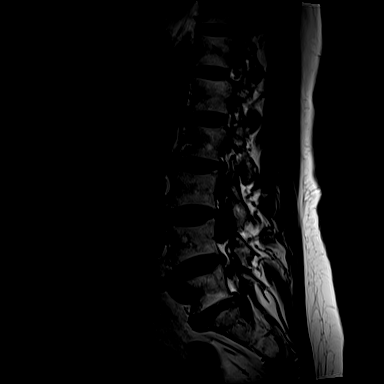
[im 15/15]
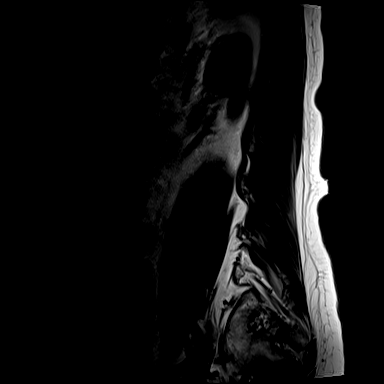

[Series 11: T1 · axial · 4.0mm · 0.28mm/px · z∈[-39,+123]mm · 3 of 41 slices shown (2 of 2)]
[im 6/41]
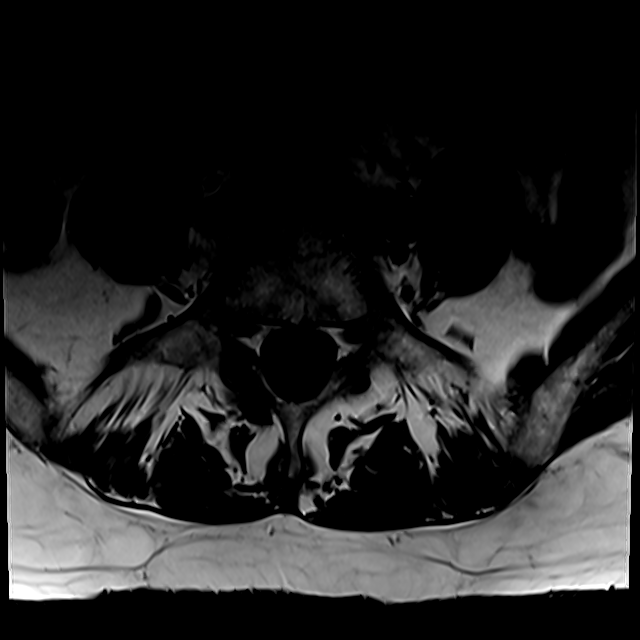
[im 21/41]
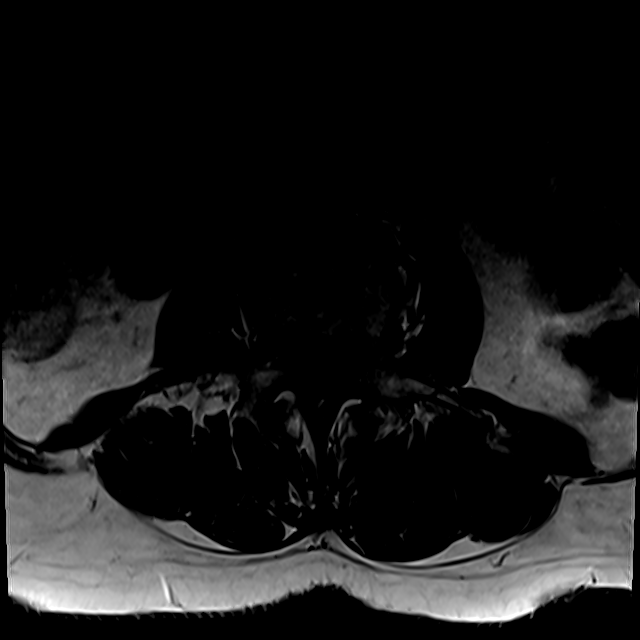
[im 35/41]
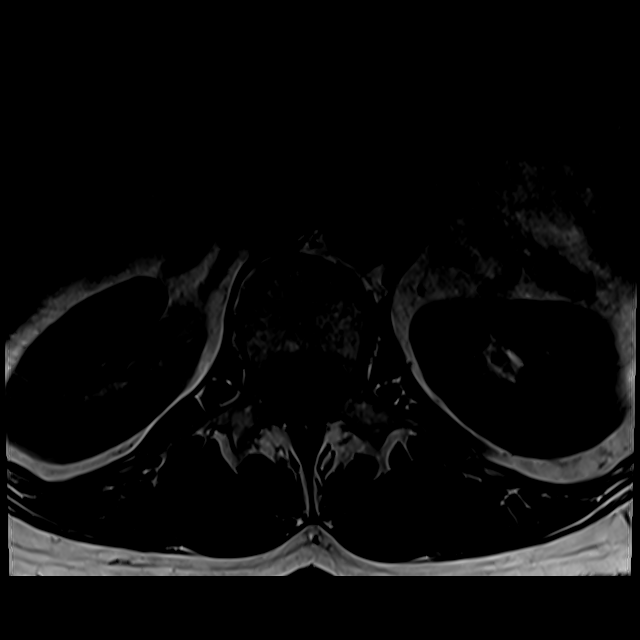

[Series 14: T2 · axial · 4.0mm · 0.28mm/px · z∈[-63,+123]mm · 6 of 41 slices shown (2 of 2)]
[im 1/41]
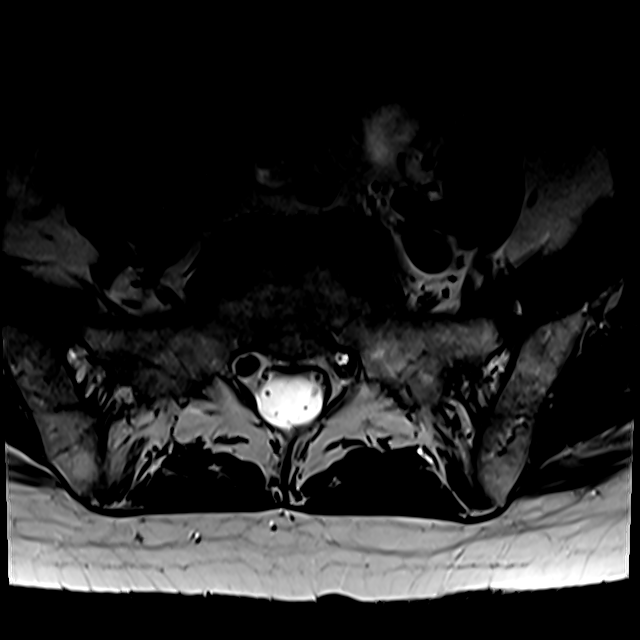
[im 6/41]
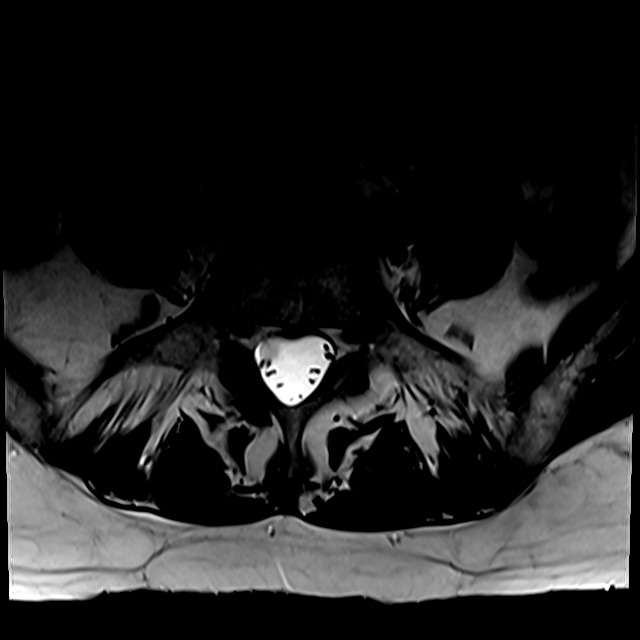
[im 12/41]
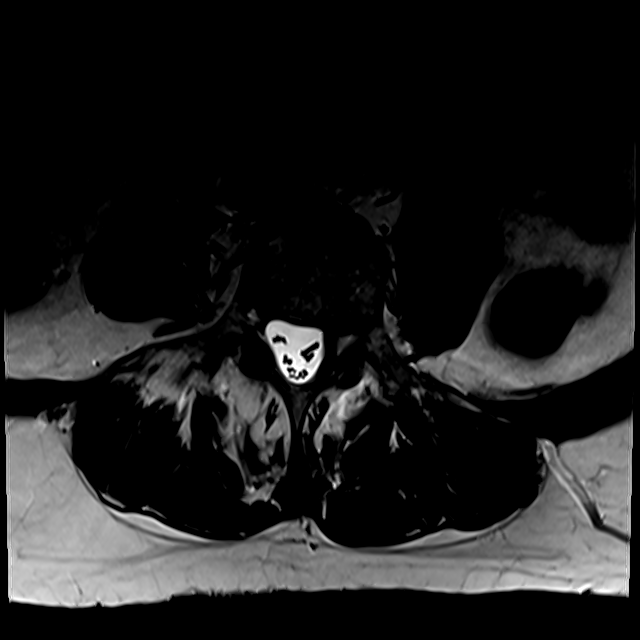
[im 18/41]
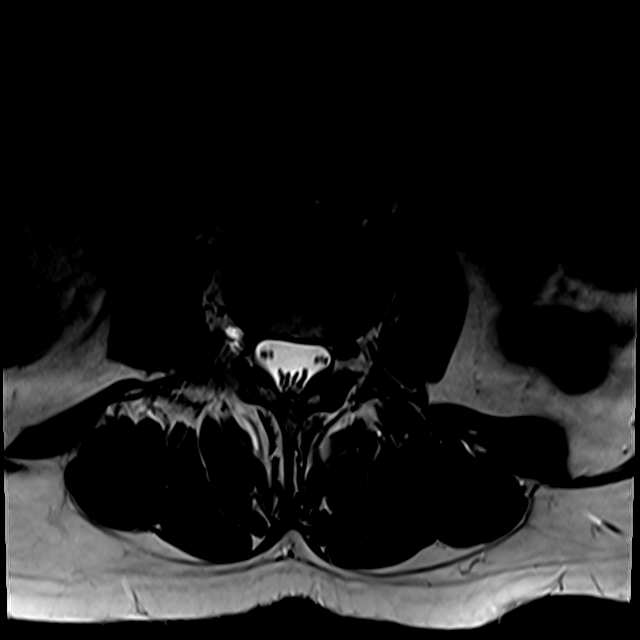
[im 21/41]
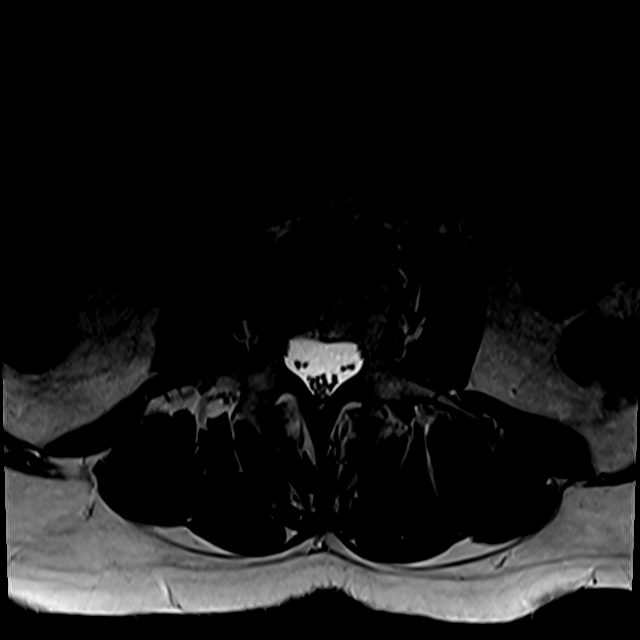
[im 35/41]
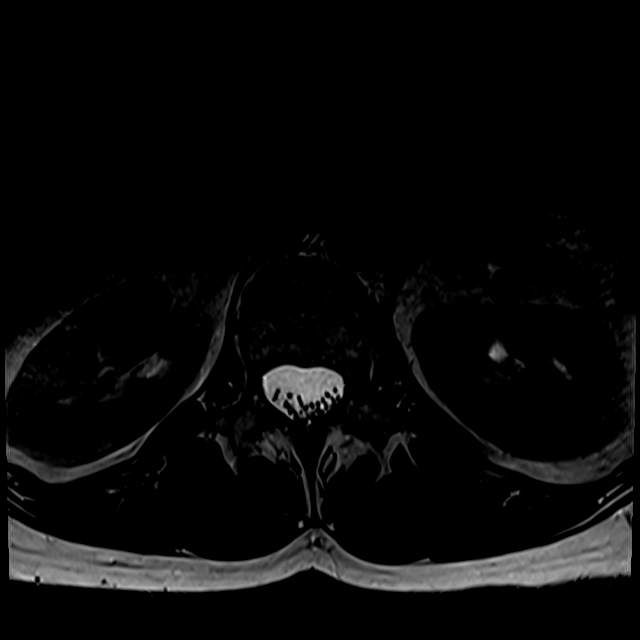

[18 of 48 positions shown; findings below may reference images not displayed]

FINDINGS: Segmentation:  Normal

Alignment:  Mild retrolisthesis L2-3 otherwise normal alignment

Vertebrae:  Negative for fracture or mass.  No bone marrow edema.

Conus medullaris and cauda equina: Conus extends to the T12-L1
level. Conus and cauda equina appear normal.

Paraspinal and other soft tissues: Negative for mass or adenopathy.

Disc levels:

T12-L1: Negative

L1-2: Negative

L2-3: Mild retrolisthesis. Small central annular fissure with small
central disc protrusion. Mild facet degeneration. Negative for
stenosis.

L3-4: Mild disc and facet degeneration without stenosis.

L4-5: Mild disc bulging. Moderate facet hypertrophy. Mild
subarticular stenosis bilaterally.

L5-S1: Right-sided disc protrusion with impingement of the right S1
nerve root. No impingement of the left S1 nerve root. Moderate to
advanced disc degeneration at L5-S1
IMPRESSION: Small central disc protrusion L2-3 without stenosis

Mild subarticular stenosis bilaterally L4-5

Right-sided disc protrusion L5-S1 with right S1 nerve root
impingement.
